# Patient Record
Sex: Male | Born: 1940 | Race: White | Hispanic: No | Marital: Married
Health system: Southern US, Community
[De-identification: ages and names within clinical notes are randomized; demographics above are authoritative.]

---

## 2007-04-28 ENCOUNTER — Ambulatory Visit: Payer: Self-pay | Admitting: Family Medicine

## 2007-05-12 ENCOUNTER — Ambulatory Visit: Payer: Self-pay | Admitting: Specialist

## 2008-10-20 ENCOUNTER — Ambulatory Visit: Payer: Self-pay | Admitting: Family Medicine

## 2009-12-26 IMAGING — CR DG LUMBAR SPINE COMPLETE 4+V
1 series · 5 of 5 positions shown · non-contrast
Comparison: none

REASON FOR EXAM: lower back pain
COMMENTS:

[Series 1: view not recorded · 0.17mm/px · 5 of 5 slices shown]
[im 1/5]
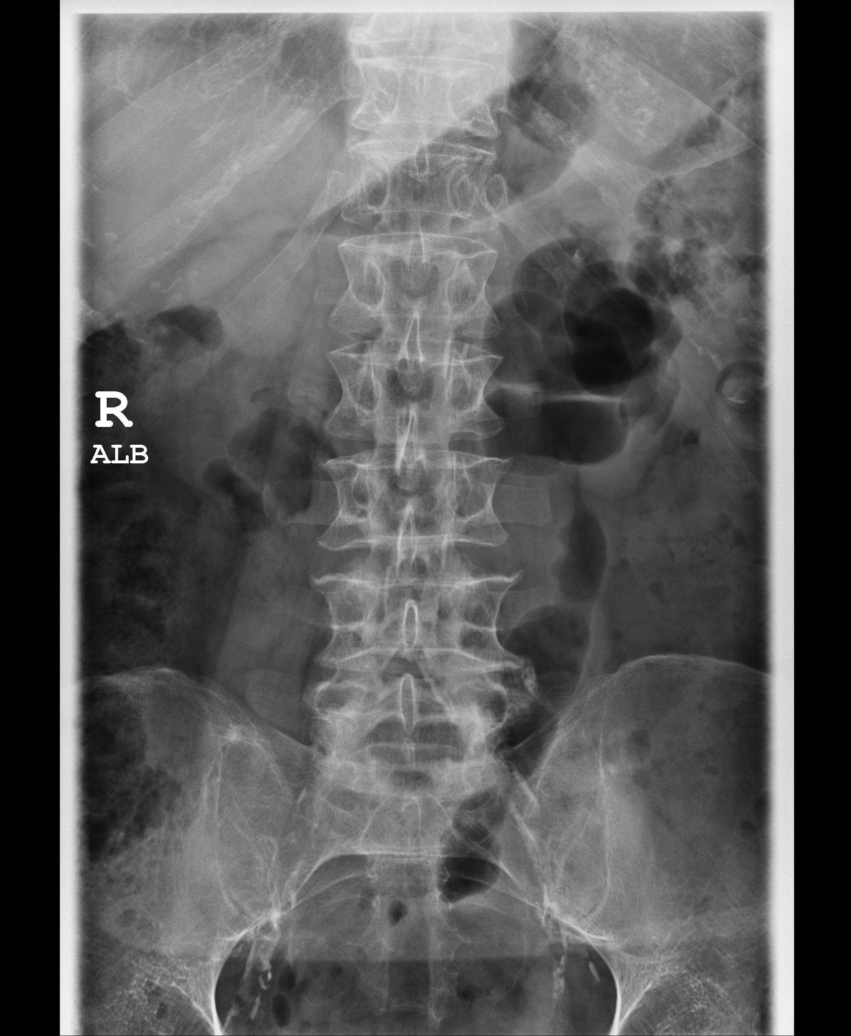
[im 2/5]
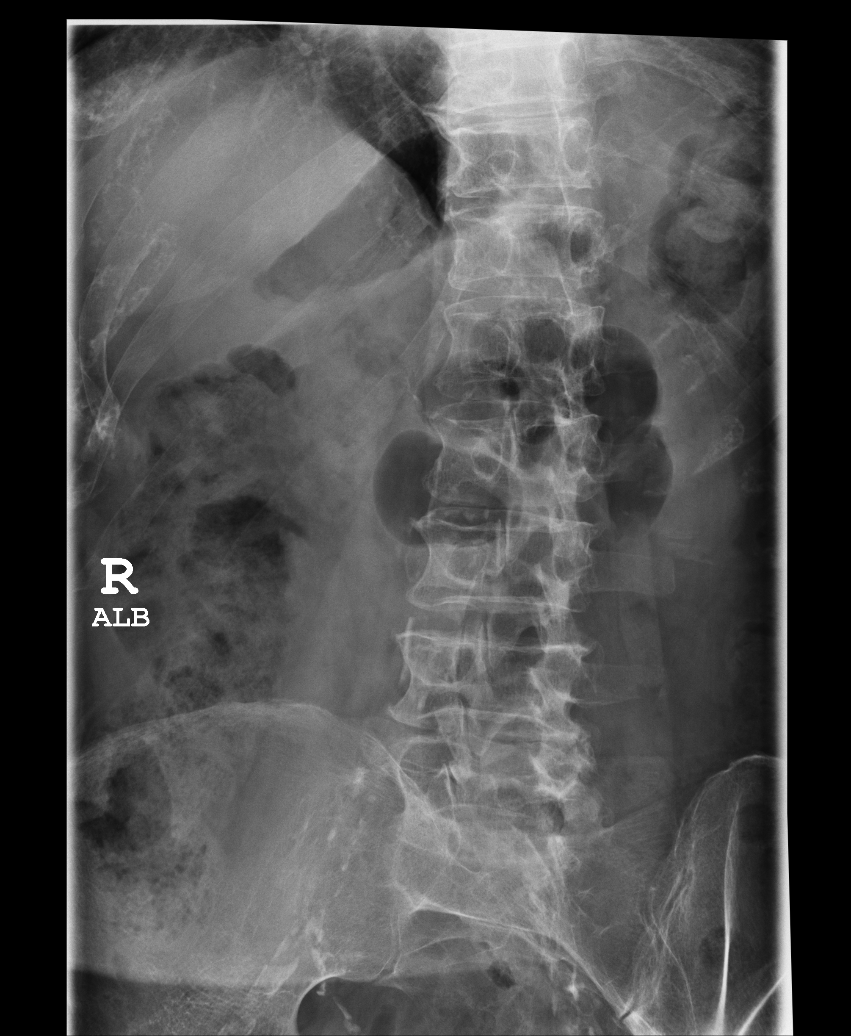
[im 3/5]
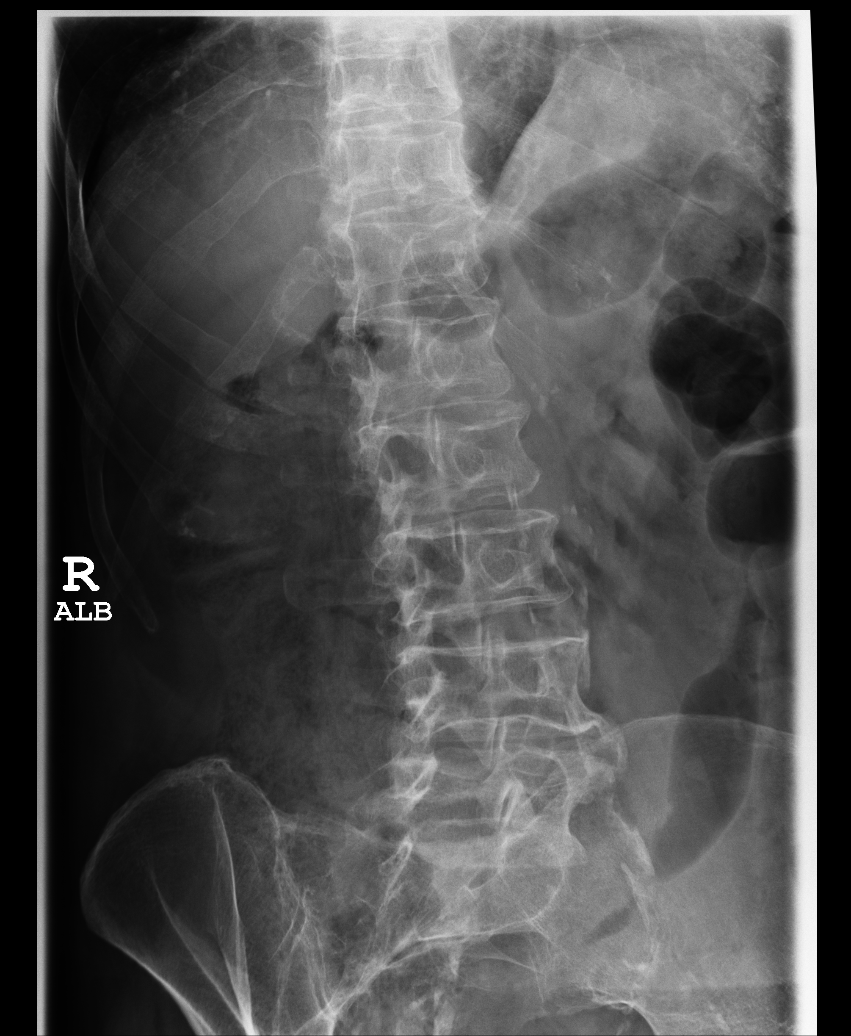
[im 4/5]
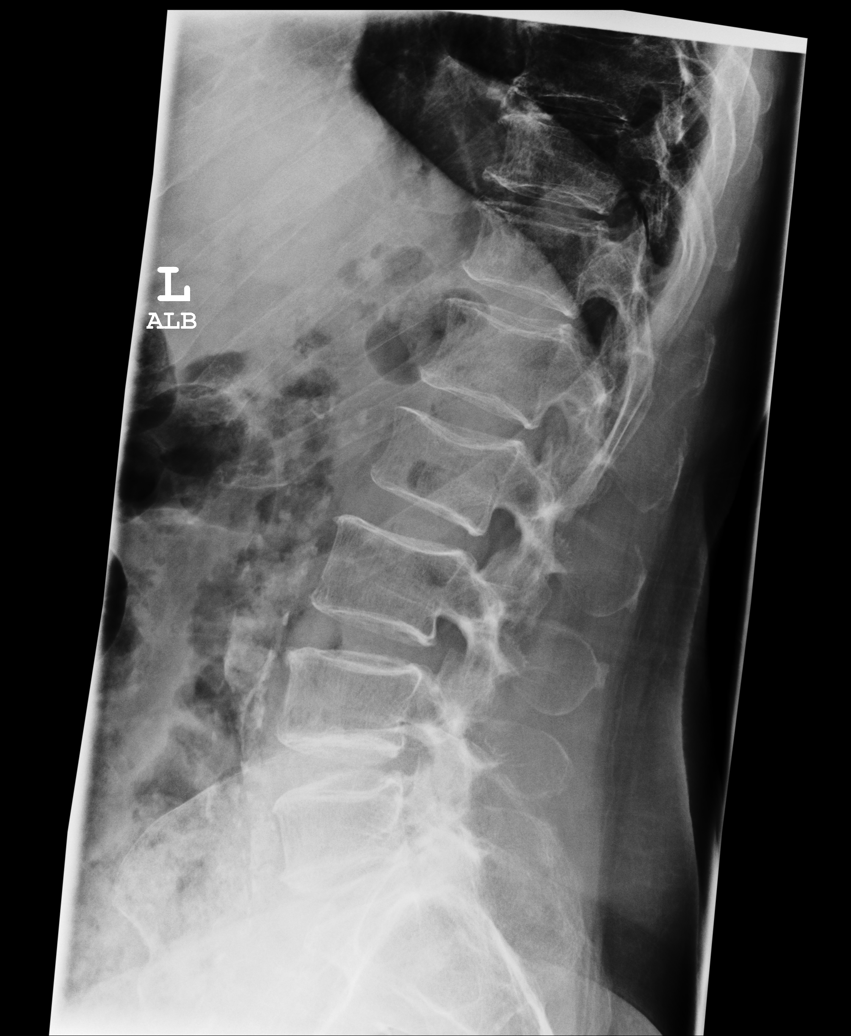
[im 5/5]
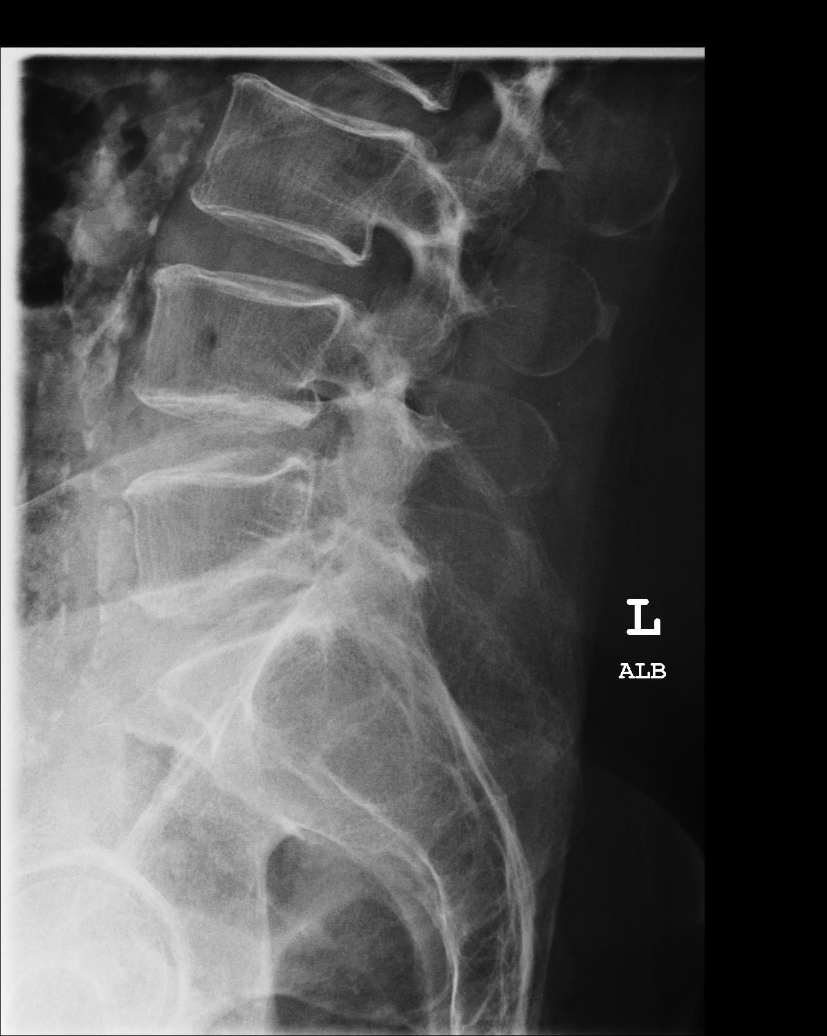

[5 of 5 positions shown; findings below may reference images not displayed]

PROCEDURE:     MDR - M[REDACTED] SPINE WITH OBLIQUES  - October 20, 2008  [DATE]

RESULT:     AP, lateral and oblique views were obtained. No acute vertebral
body fracture is seen. There is very slight central and anterior vertebral
compression deformity of L2 that appears old. The vertebral body alignment
is normal. Oblique view shows no significant abnormalities of the articular
facets. The pedicles are bilaterally intact.
IMPRESSION: No acute changes are identified.

## 2018-07-29 ENCOUNTER — Telehealth: Payer: Self-pay

## 2018-08-28 NOTE — Telephone Encounter (Signed)
ERROR

## 2018-12-09 ENCOUNTER — Encounter: Payer: Self-pay | Admitting: Family Medicine

## 2021-09-18 DEATH — deceased
